# Patient Record
Sex: Female | Born: 2000 | Race: Black or African American | Hispanic: No | Marital: Single | Smoking: Never smoker
Health system: Southern US, Community
[De-identification: ages and names within clinical notes are randomized; demographics above are authoritative.]

## PROBLEM LIST (undated history)

## (undated) HISTORY — PX: KNEE ARTHROSCOPY WITH ANTERIOR CRUCIATE LIGAMENT (ACL) REPAIR: SHX5644

---
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
## 2018-12-29 IMAGING — CR DG TIBIA/FIBULA 2V*L*
1 series · 4 of 4 positions shown · non-contrast
Comparison: No recent.

CLINICAL DATA: Pain lower extremity.  No injury.

EXAM:
LEFT TIBIA AND FIBULA - 2 VIEW

[Series 1: dg tibia/fibula left · 0.14mm/px · 4 of 4 slices shown]
[im 1/4]
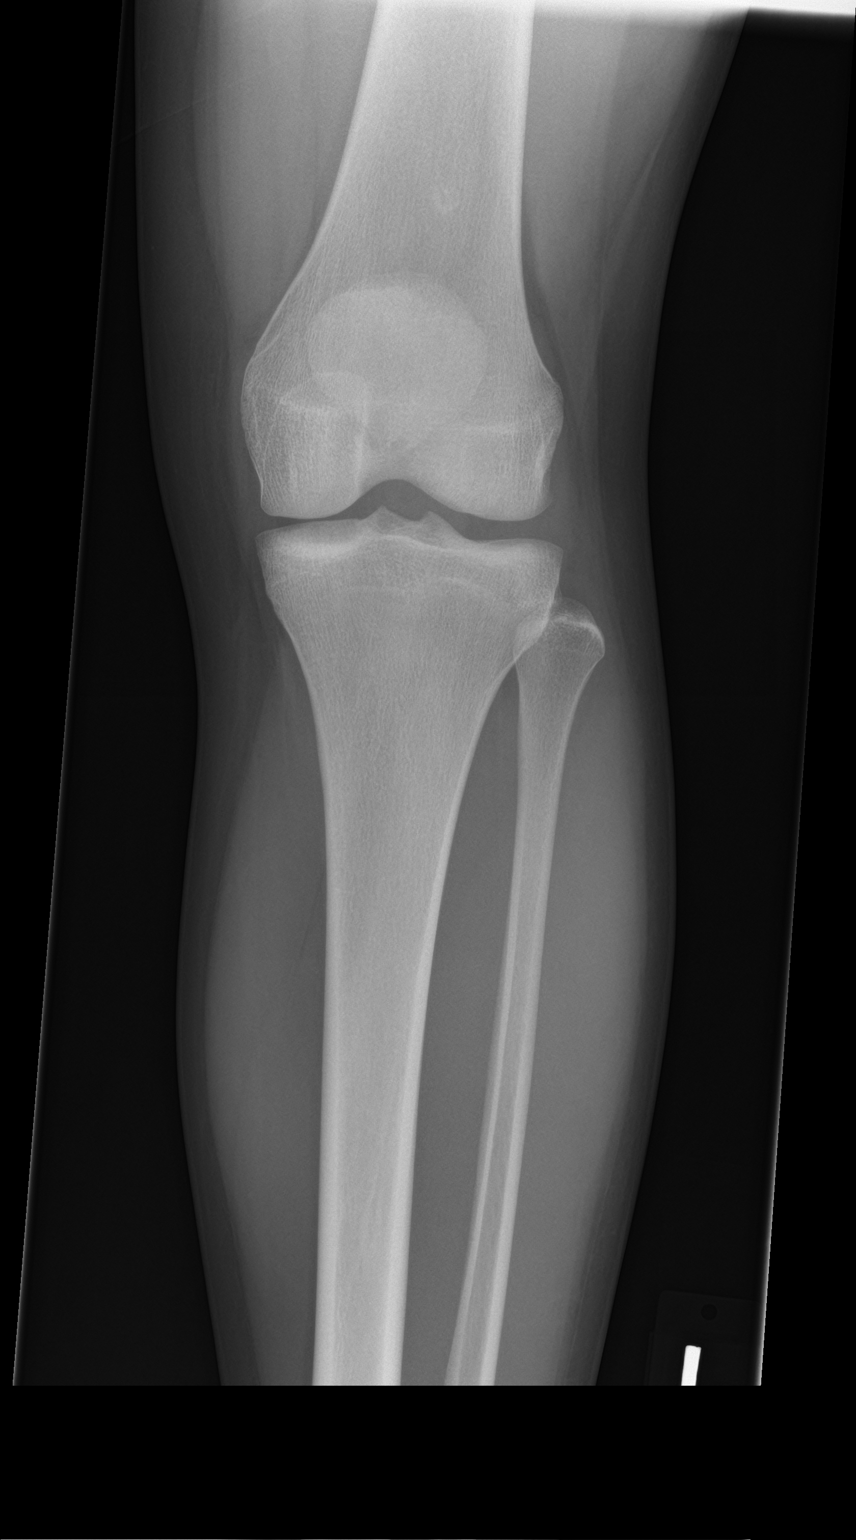
[im 2/4]
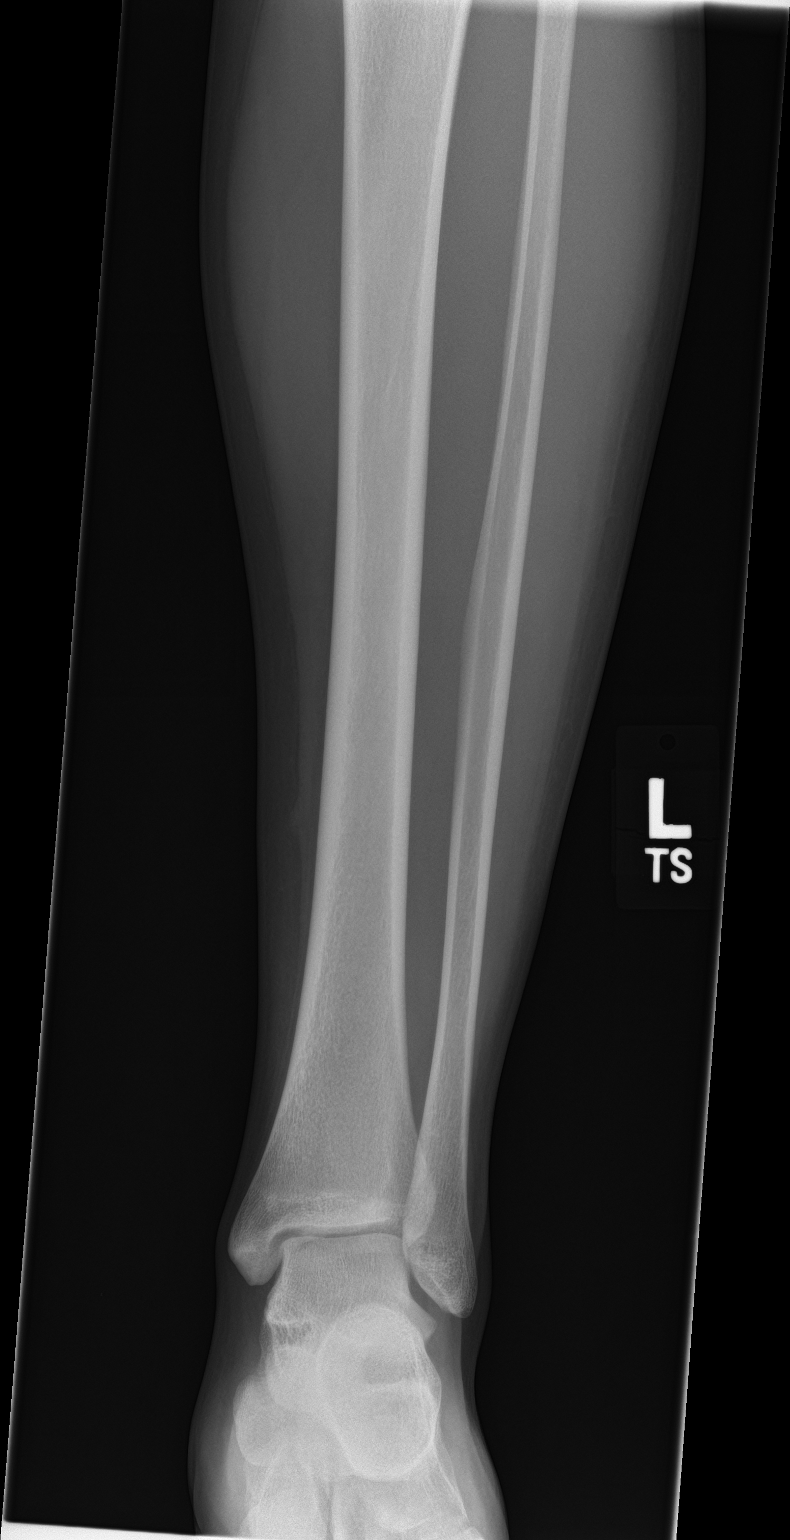
[im 3/4]
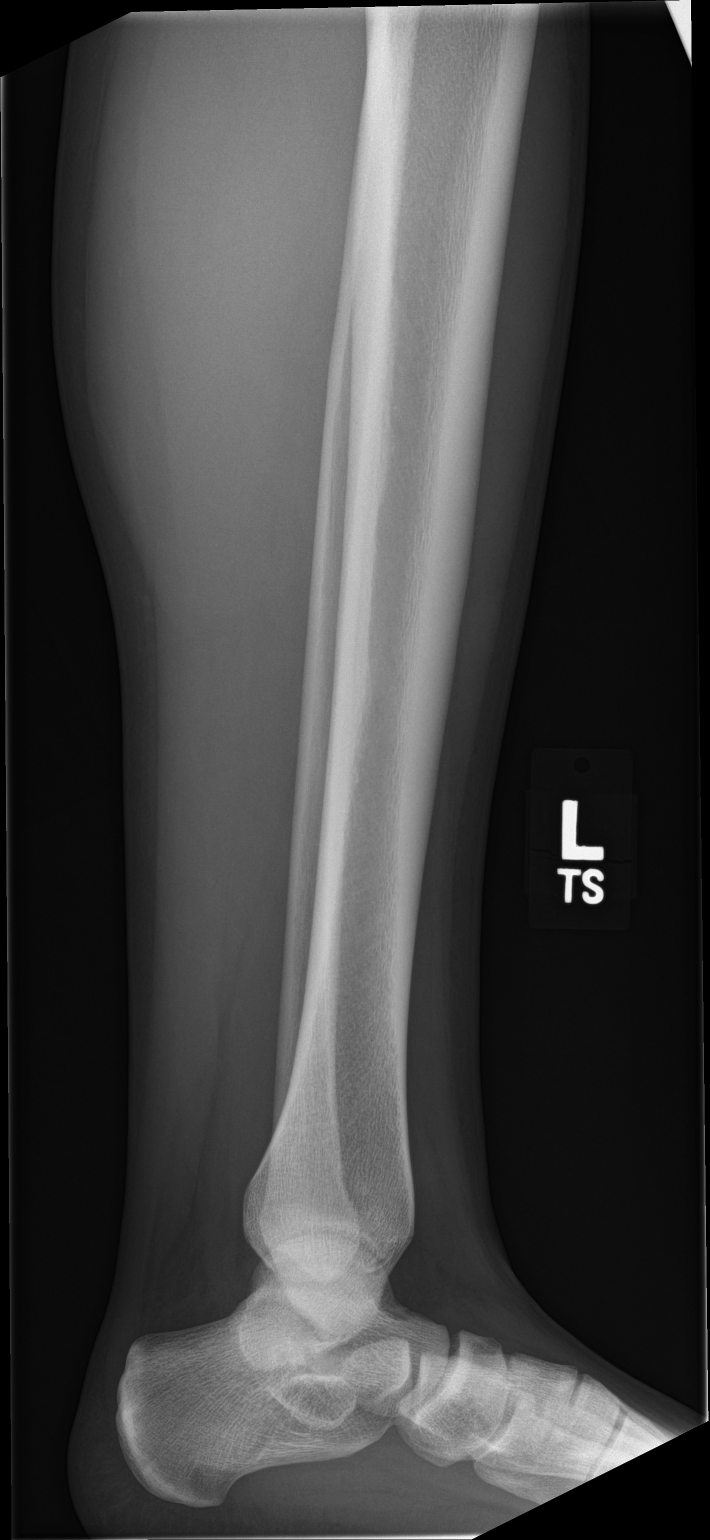
[im 4/4]
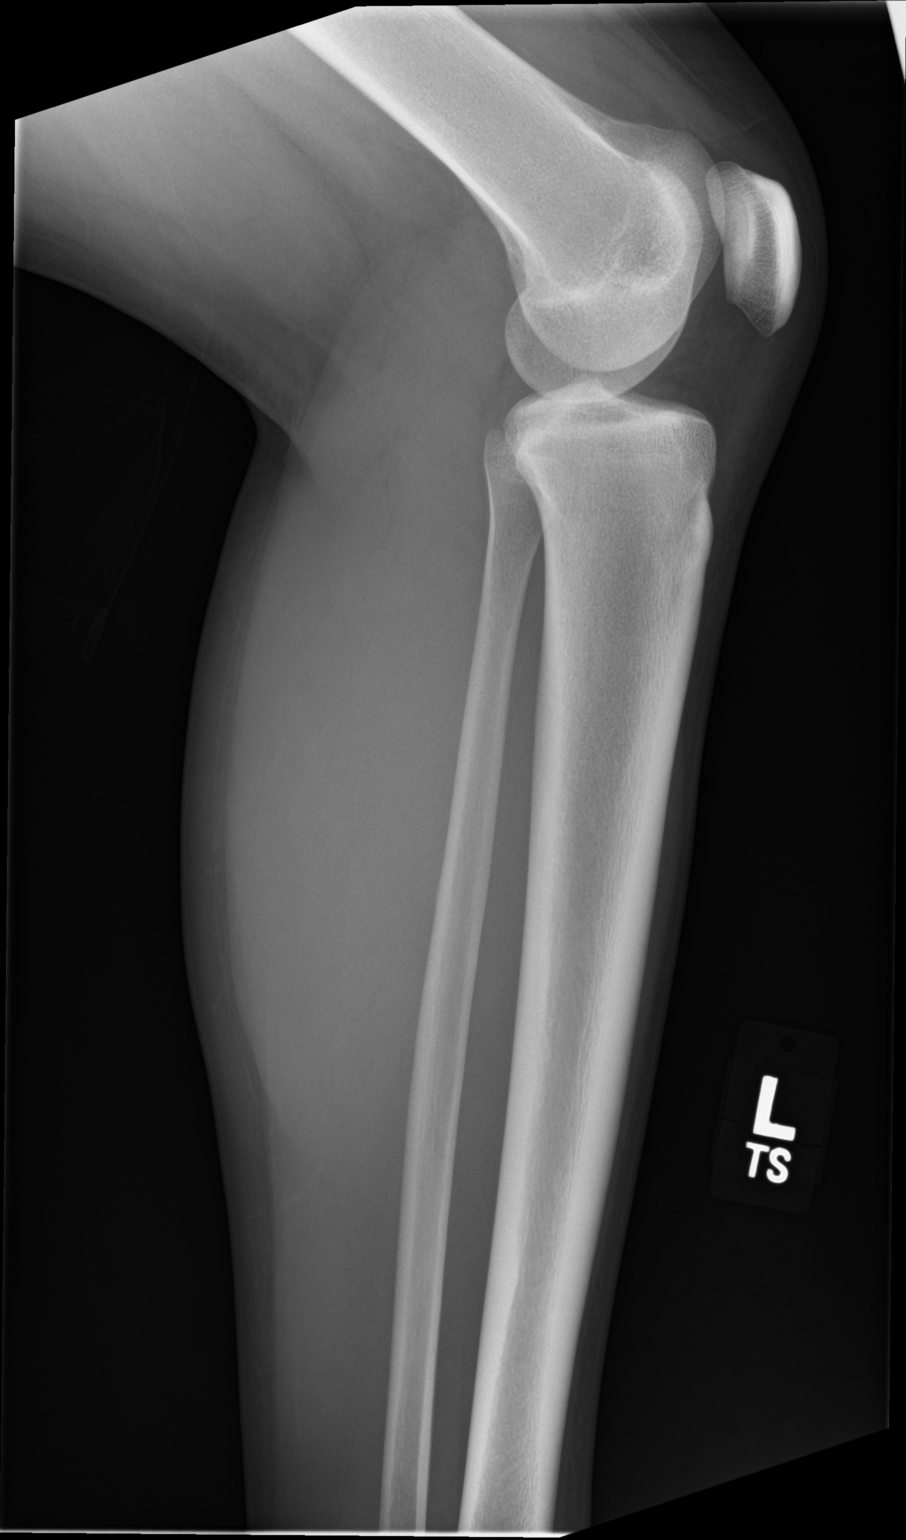

[4 of 4 positions shown; findings below may reference images not displayed]

FINDINGS: No acute bony or joint abnormality identified. No evidence of
fracture or dislocation.
IMPRESSION: No acute or focal abnormality.

## 2022-09-10 ENCOUNTER — Ambulatory Visit
Admission: EM | Admit: 2022-09-10 | Discharge: 2022-09-10 | Disposition: A | Discharge status: Home / Self Care | Payer: BC Managed Care – PPO | Attending: Emergency Medicine | Admitting: Emergency Medicine

## 2022-09-10 DIAGNOSIS — J029 Acute pharyngitis, unspecified: Secondary | ICD-10-CM | POA: Insufficient documentation

## 2022-09-10 DIAGNOSIS — Z1152 Encounter for screening for COVID-19: Secondary | ICD-10-CM | POA: Diagnosis not present

## 2022-09-10 LAB — POCT RAPID STREP A (OFFICE): Rapid Strep A Screen: NEGATIVE

## 2022-09-10 MED ORDER — LIDOCAINE VISCOUS HCL 2 % MT SOLN: 15.0000 mL | OROMUCOSAL | 0 refills | Status: AC | PRN

## 2022-09-10 NOTE — ED Provider Notes (Signed)
Elizabeth Baker    CSN: [id number] Arrival date & time: 09/10/22  1230      History   Chief Complaint Chief Complaint  Patient presents with   Sore Throat    HPI Elizabeth Baker is a 22 y.o. female.  Patient presents with 1 day history of sore throat.  No fever, rash, difficulty swallowing, cough, shortness of breath, or other symptoms.  No OTC medications taken.  No pertinent medical history.  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   The history is provided by the patient and medical records.    History reviewed. No pertinent past medical history.  There are no problems to display for this patient.   Past Surgical History:  Procedure Laterality Date   KNEE ARTHROSCOPY WITH ANTERIOR CRUCIATE LIGAMENT (ACL) REPAIR Right     OB History   No obstetric history on file.      Home Medications    Prior to Admission medications   Medication Sig Start Date End Date Taking? Authorizing Provider  lidocaine (XYLOCAINE) 2 % solution Use as directed 15 mLs in the mouth or throat as needed for mouth pain. 09/10/22  Yes Sharion Balloon, NP  Cetirizine HCl 10 MG CAPS Take by mouth.    [provider]  montelukast (SINGULAIR) 10 MG tablet Take 10 mg by mouth at bedtime.    [provider]  [name] 150-35 MCG/24HR transdermal patch APPLY 1 PATCH ONCE A WEEK    [provider]    Family History History reviewed. No pertinent family history.  Social History Social History   Tobacco Use   Smoking status: Never   Smokeless tobacco: Never  Substance Use Topics   Alcohol use: Never   Drug use: Never     Allergies   Patient has no known allergies.   Review of Systems Review of Systems  Constitutional:  Negative for chills and fever.  HENT:  Positive for sore throat. Negative for ear pain.   Respiratory:  Negative for cough and shortness of breath.   Gastrointestinal:  Negative for diarrhea and vomiting.  Skin:  Negative for color change and  rash.  All other systems reviewed and are negative.    Physical Exam Triage Vital Signs ED Triage Vitals [09/10/22 1342]  Enc Vitals Group     BP      Pulse      Resp      Temp      Temp src      SpO2      Weight      Height      Head Circumference      Peak Flow      Pain Score 5     Pain Loc      Pain Edu?      Excl. in [place]?    No data found.  Updated Vital Signs BP 132/80   Pulse 80   Temp 97.9 F (36.6 C)   Resp 18   LMP 08/17/2022   SpO2 98%   Visual Acuity Right Eye Distance:   Left Eye Distance:   Bilateral Distance:    Right Eye Near:   Left Eye Near:    Bilateral Near:     Physical Exam Vitals and nursing note reviewed.  Constitutional:      General: She is not in acute distress.    Appearance: Normal appearance. She is well-developed. She is not ill-appearing.  HENT:     Right Ear: Tympanic membrane normal.  Left Ear: Tympanic membrane normal.     Nose: Nose normal.     Mouth/Throat:     Mouth: Mucous membranes are moist.     Pharynx: Oropharynx is clear.     Comments: Clear PND. Cardiovascular:     Rate and Rhythm: Normal rate and regular rhythm.     Heart sounds: Normal heart sounds.  Pulmonary:     Effort: Pulmonary effort is normal. No respiratory distress.     Breath sounds: Normal breath sounds.  Musculoskeletal:     Cervical back: Neck supple.  Skin:    General: Skin is warm and dry.  Neurological:     Mental Status: She is alert.  Psychiatric:        Mood and Affect: Mood normal.        Behavior: Behavior normal.      UC Treatments / Results  Labs (all labs ordered are listed, but only abnormal results are displayed) Labs Reviewed  SARS CORONAVIRUS 2 (TAT 6-24 HRS)  POCT RAPID STREP A (OFFICE)    EKG   Radiology No results found.  Procedures Procedures (including critical care time)  Medications Ordered in UC Medications - No data to display  Initial Impression / Assessment and Plan / UC Course  I  have reviewed the triage vital signs and the nursing notes.  Pertinent labs & imaging results that were available during my care of the patient were reviewed by me and considered in my medical decision making (see chart for details).    Viral pharyngitis.  Rapid strep negative.  COVID pending.  Treating sore throat with viscous lidocaine; gargle and spit.  Discussed other symptomatic treatment including Tylenol, rest, hydration.  Instructed patient to follow up with her PCP if symptoms are not improving.  She agrees to plan of care.   Final Clinical Impressions(s) / UC Diagnoses   Final diagnoses:  Viral pharyngitis     Discharge Instructions      Your strep test is negative.  Your COVID test is pending.    Use the viscous lidocaine as directed.  Take Tylenol as needed for fever or discomfort.  Rest and keep yourself hydrated.    Follow-up with your primary care provider if your symptoms are not improving.         ED Prescriptions     Medication Sig Dispense Auth. Provider   lidocaine (XYLOCAINE) 2 % solution Use as directed 15 mLs in the mouth or throat as needed for mouth pain. 100 mL Sharion Balloon, NP      PDMP not reviewed this encounter.   Sharion Balloon, NP 09/10/22 [phone]

## 2022-09-10 NOTE — Discharge Instructions (Addendum)
Your strep test is negative.    Your COVID test is pending.    Use the viscous lidocaine as directed.  Take Tylenol as needed for fever or discomfort.  Rest and keep yourself hydrated.    Follow-up with your primary care provider if your symptoms are not improving.     

## 2022-09-10 NOTE — ED Triage Notes (Signed)
Patient to Urgent Care with complaints of sore throat. Denies any known fevers.   Symptoms started yesterday.

## 2022-09-12 LAB — SARS CORONAVIRUS 2 (TAT 6-24 HRS): SARS Coronavirus 2: NEGATIVE
